# Patient Record
Sex: Female | Born: 1965 | Race: Black or African American | Hispanic: No | Marital: Married | Smoking: Never smoker
Health system: Southern US, Community
[De-identification: ages and names within clinical notes are randomized; demographics above are authoritative.]

## PROBLEM LIST (undated history)

## (undated) DIAGNOSIS — F419 Anxiety disorder, unspecified: Secondary | ICD-10-CM

## (undated) DIAGNOSIS — M792 Neuralgia and neuritis, unspecified: Secondary | ICD-10-CM

## (undated) DIAGNOSIS — I10 Essential (primary) hypertension: Secondary | ICD-10-CM

## (undated) HISTORY — PX: ABDOMINAL HYSTERECTOMY: SHX81

## (undated) HISTORY — DX: Anxiety disorder, unspecified: F41.9

## (undated) HISTORY — PX: WISDOM TOOTH EXTRACTION: SHX21

---

## 2005-01-03 ENCOUNTER — Ambulatory Visit: Payer: Self-pay | Admitting: General Practice

## 2005-02-17 ENCOUNTER — Ambulatory Visit: Payer: Self-pay | Admitting: Oncology

## 2005-03-29 ENCOUNTER — Ambulatory Visit: Payer: Self-pay | Admitting: Gastroenterology

## 2005-04-15 ENCOUNTER — Ambulatory Visit: Payer: Self-pay | Admitting: Oncology

## 2006-07-11 ENCOUNTER — Emergency Department: Payer: Self-pay | Admitting: Emergency Medicine

## 2006-08-08 ENCOUNTER — Ambulatory Visit: Payer: Self-pay | Admitting: Family Medicine

## 2007-11-24 ENCOUNTER — Emergency Department: Payer: Self-pay | Admitting: Emergency Medicine

## 2008-08-02 ENCOUNTER — Emergency Department: Payer: Self-pay | Admitting: Emergency Medicine

## 2009-09-05 ENCOUNTER — Emergency Department: Payer: Self-pay | Admitting: Emergency Medicine

## 2009-12-21 ENCOUNTER — Ambulatory Visit: Payer: Self-pay | Admitting: Women's Health

## 2010-06-15 ENCOUNTER — Emergency Department: Payer: Self-pay | Admitting: Emergency Medicine

## 2011-02-21 ENCOUNTER — Inpatient Hospital Stay: Payer: Self-pay | Admitting: Student

## 2011-02-22 DIAGNOSIS — R079 Chest pain, unspecified: Secondary | ICD-10-CM

## 2011-03-01 ENCOUNTER — Encounter: Payer: Self-pay | Admitting: Cardiovascular Disease

## 2011-04-18 ENCOUNTER — Ambulatory Visit: Payer: Self-pay | Admitting: Family Medicine

## 2012-07-26 ENCOUNTER — Emergency Department: Payer: Self-pay | Admitting: Emergency Medicine

## 2012-07-26 LAB — CBC
HGB: 12.3 g/dL (ref 12.0–16.0)
MCHC: 32.7 g/dL (ref 32.0–36.0)
MCV: 85 fL (ref 80–100)
RDW: 14.7 % — ABNORMAL HIGH (ref 11.5–14.5)

## 2012-07-26 LAB — BASIC METABOLIC PANEL
Calcium, Total: 9.9 mg/dL (ref 8.5–10.1)
EGFR (Non-African Amer.): >60
Osmolality: 273 (ref 275–301)
Potassium: 3.9 mmol/L (ref 3.5–5.1)
Sodium: 137 mmol/L (ref 136–145)

## 2012-07-26 LAB — TROPONIN I: Troponin-I: <0.02 ng/mL

## 2012-11-26 DIAGNOSIS — N938 Other specified abnormal uterine and vaginal bleeding: Secondary | ICD-10-CM | POA: Insufficient documentation

## 2013-01-05 DIAGNOSIS — I10 Essential (primary) hypertension: Secondary | ICD-10-CM | POA: Insufficient documentation

## 2013-06-28 ENCOUNTER — Emergency Department: Payer: Self-pay | Admitting: Emergency Medicine

## 2013-07-01 DIAGNOSIS — J302 Other seasonal allergic rhinitis: Secondary | ICD-10-CM | POA: Insufficient documentation

## 2013-11-26 DIAGNOSIS — Z639 Problem related to primary support group, unspecified: Secondary | ICD-10-CM | POA: Insufficient documentation

## 2014-07-06 NOTE — Consult Note (Signed)
PATIENT NAME:  Debbie Hodge, Debbie Hodge DATE OF BIRTH:  1965-06-29  DATE OF CONSULTATION:  02/22/2011  REFERRING PHYSICIAN:   CONSULTING PHYSICIAN:  Hemang K. Sherryll BurgerShah, MD  PRIMARY CARE PHYSICIAN: Debbie MinaJames Hedrick, MD   PRIMARY NEUROLOGIST: [facility] Threasa [facility]    REASON FOR CONSULTATION: Right-sided weakness and numbness and chest pressure.   HISTORY OF PRESENT ILLNESS: Debbie Hodge is a 49 year old African American female who has been having right-sided numbness and weakness gradually getting worse since the last one week. Yesterday she started having some chest pain and heaviness in her chest which brought her to the ER. She has had negative Cardiology work-up so far including EKG and nuclear stress test.   The patient carries a diagnosis of multiple sclerosis since around November/December 2011. The patient felt like her symptoms started around August 2011 when she was having dizziness, blacking out spell with loss of consciousness preceded by presyncopal symptoms. If she goes into a hot shower, she feels worse and she has to "cool down". She occasionally has tunnel vision and she doesn't see what is on her side and bumps into things.   Occasionally she used to fall due to right leg buckling. [facility] with MRI of the brain and cervical spine spinal tap, lots of blood work, and sounded like visual evoked potential.   She had "spots" on her brain and spinal cord with diagnosis of possible multiple sclerosis but was not started on any disease modifying agents and was advised to have a repeat MRI of the brain in April but the patient didn't go as she was not having any symptoms.   The patient mentioned around in June of 2012 patient restarted having symptoms where she was stumbling and her vision was blurry and [facility].   The [facility] was [facility] but she will be in the  hospital [facility] so will not be able to make it to that appointment.    The patient also complained of migraine headaches for the last one week where she has bifrontal pounding headache associated with photophobia, phonophobia, nausea and vomiting lasting for 30 to 40 minutes which happens 2 to 3 times a week. This started in the early part of 2012 which has gotten better now.   PAST MEDICAL HISTORY:  1. Multiple sclerosis. 2. Hypertension.   PAST SURGICAL HISTORY: Positive for Cesarean section.  MEDICATIONS: I reviewed her current medication list.   ALLERGIES: Sulfa drugs.  SOCIAL HISTORY: She does not smoke, does not drink alcohol. She does not do any drugs. 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   FAMILY HISTORY: Father had lupus. No history of any autoimmune disease or coronary artery disease, etc.   REVIEW OF SYSTEMS: 10 system review of systems was asked and was negative except for that mentioned in history of present illness.   PHYSICAL EXAMINATION:   VITAL SIGNS: Temperature 98, pulse 90, respiratory rate 18, blood pressure 123/73, pulse oximetry 100%.   LUNGS: Clear to auscultation.  HEART: S1, S2 heart sounds. Carotid exam did not reveal any bruit.   NEUROLOGIC:  MENTAL STATUS: She was alert, oriented, followed two-step inverted commands, was able to provide the history by herself. Attention, concentration, and memory seemed to be intact.   On her cranial nerve exam, her pupils are equal, round, and reactive. Extraocular movements are intact. I did not  see any afferent pupillary defect. Her left face seemed to be a little bit asymmetric compared to the right side evidenced by not so prominent nasolabial fold but the husband felt like that was her baseline.   Her tongue was midline. Her facial sensations were intact. Her hearing was intact.   It was difficult to check her visual fields. Her funduscopic exam did not reveal any optic discoloration, etc.   On her motor  exam she had normal tone and strength of 5 out of 5.   She had also normal tone and strength in her bilateral lower extremities but the patient mentioned that before she came to the hospital she had a hard time opening up her medication bottles with the right hand and it was difficult to hyperextend her right knee but she was able to do now during my exam.   Her reflexes were symmetric. She does not have Hoffmann sign. Her toes were mute.  LABORATORY, DIAGNOSTIC, AND RADIOLOGICAL DATA: On her MRI of the brain with and without contrast she does have some bilateral subcortical, occipital, and parietal FLAIR hyperintensities which do not enhance with gadolinium.   They are not characteristic of demyelinating disease. The are not characteristically periventricular or perpendicular to corpus callosum, etc.   ASSESSMENT AND PLAN: Right-sided weakness and numbness, transient, which has resolved in a patient who carries a diagnosis of MS. I would like to confirm the diagnosis by obtaining MRI of the cervical spine with and without contrast. I would like to look at her lumbar puncture and other visual evoked potential report but based on her MRI I cannot give her diagnosis of relapsing-remitting multiple sclerosis.   I mentioned this to the patient and it seems like her diagnosis was questionable from the beginning as they have not started her on any disease modifying agent.   The patient had plans to go to [facility] for second opinion so I advised the patient to make sure to get her MRI of the brain and cervical spine with and without contrast on a CD from [facility] as well as from [facility] during her visit to [facility].   I agree with giving Solu-Medrol for now. Once her three days are over, she can get oral prednisone taper 60, 50, 40, 20, 10 per day.   Chest pain is significantly improved. Work-up per hospitalist physician.   It was my pleasure to see Debbie Hodge here in the hospital.  Feel free to contact me with any further questions. I will see her on a p.r.n. basis.   ____________________________ Durene Cal. Sherryll Burger, MD hks:drc D: 02/22/2011 21:06:00 ET T: 02/23/2011 08:41:41 ET JOB#: [id number]  cc: Hemang K. Sherryll Burger, MD, <Dictator> Durene Cal [facility] MD ELECTRONICALLY SIGNED 03/18/2011 9:08

## 2014-08-06 DIAGNOSIS — R21 Rash and other nonspecific skin eruption: Secondary | ICD-10-CM | POA: Insufficient documentation

## 2014-08-06 DIAGNOSIS — M545 Low back pain, unspecified: Secondary | ICD-10-CM | POA: Insufficient documentation

## 2014-09-03 DIAGNOSIS — R7301 Impaired fasting glucose: Secondary | ICD-10-CM | POA: Insufficient documentation

## 2014-09-03 DIAGNOSIS — IMO0002 Reserved for concepts with insufficient information to code with codable children: Secondary | ICD-10-CM | POA: Insufficient documentation

## 2014-09-03 DIAGNOSIS — R899 Unspecified abnormal finding in specimens from other organs, systems and tissues: Secondary | ICD-10-CM | POA: Insufficient documentation

## 2014-10-30 ENCOUNTER — Emergency Department: Payer: Managed Care, Other (non HMO)

## 2014-10-30 ENCOUNTER — Encounter: Payer: Self-pay | Admitting: Emergency Medicine

## 2014-10-30 ENCOUNTER — Emergency Department
Admission: EM | Admit: 2014-10-30 | Discharge: 2014-10-30 | Disposition: A | Discharge status: Home / Self Care | Payer: Managed Care, Other (non HMO) | Attending: Emergency Medicine | Admitting: Emergency Medicine

## 2014-10-30 ENCOUNTER — Other Ambulatory Visit: Payer: Self-pay

## 2014-10-30 DIAGNOSIS — R0602 Shortness of breath: Secondary | ICD-10-CM | POA: Diagnosis not present

## 2014-10-30 DIAGNOSIS — I10 Essential (primary) hypertension: Secondary | ICD-10-CM | POA: Insufficient documentation

## 2014-10-30 DIAGNOSIS — M546 Pain in thoracic spine: Secondary | ICD-10-CM | POA: Diagnosis not present

## 2014-10-30 DIAGNOSIS — M542 Cervicalgia: Secondary | ICD-10-CM | POA: Insufficient documentation

## 2014-10-30 HISTORY — DX: Essential (primary) hypertension: I10

## 2014-10-30 MED ORDER — HYDROCODONE-ACETAMINOPHEN 5-325 MG PO TABS: 1.0000 | ORAL_TABLET | ORAL | Status: DC | PRN

## 2014-10-30 MED ORDER — KETOROLAC TROMETHAMINE 60 MG/2ML IM SOLN
60.0000 mg | Freq: Once | INTRAMUSCULAR | Status: AC
  Administered 2014-10-30: 60 mg via INTRAMUSCULAR
  Filled 2014-10-30: qty 2

## 2014-10-30 MED ORDER — CYCLOBENZAPRINE HCL 10 MG PO TABS: 10.0000 mg | ORAL_TABLET | Freq: Three times a day (TID) | ORAL | Status: DC | PRN

## 2014-10-30 NOTE — ED Provider Notes (Signed)
[facility] Emergency Department Provider Note  ____________________________________________  Time seen: Approximately 4:18 PM  I have reviewed the triage vital signs and the nursing notes.   HISTORY  Chief Complaint Back Pain    HPI Debbie Hodge is a 49 y.o. female since with complaint of mid back and neck pain. Onset today. Denies any trauma or known history of pain.Patient states that she was having some sharp episodes of difficulty breathing associated with that. Feels fine as long as the rest and not moving.  Past Medical History  Diagnosis Date  . Hypertension     There are no active problems to display for this patient.   Past Surgical History  Procedure Laterality Date  . Abdominal hysterectomy      Current Outpatient Rx  Name  Route  Sig  Dispense  Refill  . cyclobenzaprine (FLEXERIL) 10 MG tablet   Oral   Take 1 tablet (10 mg total) by mouth every 8 (eight) hours as needed for muscle spasms.   30 tablet   1   . HYDROcodone-acetaminophen (NORCO) 5-325 MG per tablet   Oral   Take 1-2 tablets by mouth every 4 (four) hours as needed for moderate pain.   8 tablet   0     Allergies Sulfa antibiotics and Tramadol  No family history on file.  Social History Social History  Substance Use Topics  . Smoking status: Never Smoker   . Smokeless tobacco: None  . Alcohol Use: No    Review of Systems Constitutional: No fever/chills Eyes: No visual changes. ENT: No sore throat. Cardiovascular: Denies chest pain. Respiratory: Positive for shortness of breath. Gastrointestinal: No abdominal pain.  No nausea, no vomiting.  No diarrhea.  No constipation. Genitourinary: Negative for dysuria. Musculoskeletal: Positive for thoracic back pain. Skin: Negative for rash. Neurological: Negative for headaches, focal weakness or numbness.  10-point ROS otherwise negative.  ____________________________________________   PHYSICAL  EXAM:  VITAL SIGNS: ED Triage Vitals  Enc Vitals Group     BP 10/30/14 1530 145/90 mmHg     Pulse Rate 10/30/14 1530 83     Resp 10/30/14 1530 22     Temp 10/30/14 1530 98 F (36.7 C)     Temp Source 10/30/14 1530 Oral     SpO2 10/30/14 1530 100 %     Weight 10/30/14 1530 177 lb (80.287 kg)     Height 10/30/14 1530  (1.549 m)     Head Cir --      Peak Flow --      Pain Score 10/30/14 1535 8     Pain Loc --      Pain Edu? --      Excl. in GC? --     Constitutional: Alert and oriented. Well appearing and in no acute distress. Eyes: Conjunctivae are normal. PERRL. EOMI. Head: Atraumatic. Nose: No congestion/rhinnorhea. Mouth/Throat: Mucous membranes are moist.  Oropharynx non-erythematous. Neck: No stridor.   Cardiovascular: Normal rate, regular rhythm. Grossly normal heart sounds.  Good peripheral circulation. Respiratory: Normal respiratory effort.  No retractions. Lungs CTAB. Gastrointestinal: Soft and nontender. No distention. No abdominal bruits. No CVA tenderness. Musculoskeletal: Positive cervical thoracic or muscle spinal tenderness. Neurologic:  Normal speech and language. No gross focal neurologic deficits are appreciated. No gait instability. Skin:  Skin is warm, dry and intact. No rash noted. Psychiatric: Mood and affect are normal. Speech and behavior are normal.  ____________________________________________   LABS (all labs ordered are listed, but  only abnormal results are displayed)  Labs Reviewed - No data to display ____________________________________________   RADIOLOGY  X-ray negative. ____________________________________________   PROCEDURES  Procedure(s) performed: None  Critical Care performed: No  ____________________________________________   INITIAL IMPRESSION / ASSESSMENT AND PLAN / ED COURSE  Pertinent labs & imaging results that were available during my care of the patient were reviewed by me and considered in my medical  decision making (see chart for details).  Myofascial strain. Patient being currently worked up for lupus and MS. Rx given for Flexeril 10 mg 3 times a day to take along with her ibuprofen 800 that she has at home. Patient voices no other emergency medical complaints at this time. ____________________________________________   FINAL CLINICAL IMPRESSION(S) / ED DIAGNOSES  Final diagnoses:  Bilateral thoracic back pain      Evangeline Dakin, PA-C 10/30/14 1703  Phineas Semen, MD 10/30/14 [phone]

## 2014-10-30 NOTE — ED Notes (Signed)
Pt presents with mid back pain and neck pain started today.

## 2014-10-30 NOTE — Discharge Instructions (Signed)
Back Pain, Adult Low back pain is very common. About 1 in 5 people have back pain.The cause of low back pain is rarely dangerous. The pain often gets better over time.About half of people with a sudden onset of back pain feel better in just 2 weeks. About 8 in 10 people feel better by 6 weeks.  CAUSES Some common causes of back pain include:  Strain of the muscles or ligaments supporting the spine.  Wear and tear (degeneration) of the spinal discs.  Arthritis.  Direct injury to the back. DIAGNOSIS Most of the time, the direct cause of low back pain is not known.However, back pain can be treated effectively even when the exact cause of the pain is unknown.Answering your caregiver's questions about your overall health and symptoms is one of the most accurate ways to make sure the cause of your pain is not dangerous. If your caregiver needs more information, he or she may order lab work or imaging tests (X-rays or MRIs).However, even if imaging tests show changes in your back, this usually does not require surgery. HOME CARE INSTRUCTIONS For many people, back pain returns.Since low back pain is rarely dangerous, it is often a condition that people can learn to manageon their own.   Remain active. It is stressful on the back to sit or stand in one place. Do not sit, drive, or stand in one place for more than 30 minutes at a time. Take short walks on level surfaces as soon as pain allows.Try to increase the length of time you walk each day.  Do not stay in bed.Resting more than 1 or 2 days can delay your recovery.  Do not avoid exercise or work.Your body is made to move.It is not dangerous to be active, even though your back may hurt.Your back will likely heal faster if you return to being active before your pain is gone.  Pay attention to your body when you bend and lift. Many people have less discomfortwhen lifting if they bend their knees, keep the load close to their bodies,and  avoid twisting. Often, the most comfortable positions are those that put less stress on your recovering back.  Find a comfortable position to sleep. Use a firm mattress and lie on your side with your knees slightly bent. If you lie on your back, put a pillow under your knees.  Only take over-the-counter or prescription medicines as directed by your caregiver. Over-the-counter medicines to reduce pain and inflammation are often the most helpful.Your caregiver may prescribe muscle relaxant drugs.These medicines help dull your pain so you can more quickly return to your normal activities and healthy exercise.  Put ice on the injured area.  Put ice in a plastic bag.  Place a towel between your skin and the bag.  Leave the ice on for 15-20 minutes, 03-04 times a day for the first 2 to 3 days. After that, ice and heat may be alternated to reduce pain and spasms.  Ask your caregiver about trying back exercises and gentle massage. This may be of some benefit.  Avoid feeling anxious or stressed.Stress increases muscle tension and can worsen back pain.It is important to recognize when you are anxious or stressed and learn ways to manage it.Exercise is a great option. SEEK MEDICAL CARE IF:  You have pain that is not relieved with rest or medicine.  You have pain that does not improve in 1 week.  You have new symptoms.  You are generally not feeling well. SEEK   IMMEDIATE MEDICAL CARE IF:   You have pain that radiates from your back into your legs.  You develop new bowel or bladder control problems.  You have unusual weakness or numbness in your arms or legs.  You develop nausea or vomiting.  You develop abdominal pain.  You feel faint. Document Released: 02/28/2005 Document Revised: 08/30/2011 Document Reviewed: 07/02/2013 ExitCare Patient Information 2015 [employer]. This information is not intended to replace advice given to you by your health care provider. Make sure you  discuss any questions you have with your health care provider.  

## 2014-10-30 NOTE — ED Notes (Signed)
EDP at bedside for eval 

## 2014-12-02 DIAGNOSIS — G894 Chronic pain syndrome: Secondary | ICD-10-CM | POA: Insufficient documentation

## 2015-02-24 ENCOUNTER — Encounter: Payer: Self-pay | Admitting: Medical Oncology

## 2015-02-24 ENCOUNTER — Emergency Department
Admission: EM | Admit: 2015-02-24 | Discharge: 2015-02-24 | Disposition: A | Discharge status: Home / Self Care | Payer: Managed Care, Other (non HMO) | Attending: Emergency Medicine | Admitting: Emergency Medicine

## 2015-02-24 DIAGNOSIS — M545 Low back pain: Secondary | ICD-10-CM | POA: Diagnosis not present

## 2015-02-24 DIAGNOSIS — I10 Essential (primary) hypertension: Secondary | ICD-10-CM | POA: Insufficient documentation

## 2015-02-24 DIAGNOSIS — M62830 Muscle spasm of back: Secondary | ICD-10-CM | POA: Insufficient documentation

## 2015-02-24 DIAGNOSIS — M7918 Myalgia, other site: Secondary | ICD-10-CM

## 2015-02-24 HISTORY — DX: Neuralgia and neuritis, unspecified: M79.2

## 2015-02-24 MED ORDER — OXYCODONE-ACETAMINOPHEN 5-325 MG PO TABS: 1.0000 | ORAL_TABLET | Freq: Four times a day (QID) | ORAL | Status: DC | PRN

## 2015-02-24 MED ORDER — METHOCARBAMOL 500 MG PO TABS
1000.0000 mg | ORAL_TABLET | Freq: Once | ORAL | Status: AC
  Administered 2015-02-24: 1000 mg via ORAL

## 2015-02-24 MED ORDER — ORPHENADRINE CITRATE 30 MG/ML IJ SOLN
60.0000 mg | Freq: Two times a day (BID) | INTRAMUSCULAR | Status: DC
  Filled 2015-02-24: qty 2

## 2015-02-24 MED ORDER — KETOROLAC TROMETHAMINE 60 MG/2ML IM SOLN
60.0000 mg | Freq: Once | INTRAMUSCULAR | Status: AC
  Administered 2015-02-24: 60 mg via INTRAMUSCULAR

## 2015-02-24 MED ORDER — HYDROMORPHONE HCL 1 MG/ML IJ SOLN
1.0000 mg | Freq: Once | INTRAMUSCULAR | Status: AC
  Administered 2015-02-24: 1 mg via INTRAMUSCULAR

## 2015-02-24 MED ORDER — HYDROMORPHONE HCL 1 MG/ML IJ SOLN
INTRAMUSCULAR | Status: AC
  Filled 2015-02-24: qty 1

## 2015-02-24 MED ORDER — KETOROLAC TROMETHAMINE 60 MG/2ML IM SOLN
INTRAMUSCULAR | Status: AC
  Filled 2015-02-24: qty 2

## 2015-02-24 MED ORDER — METHOCARBAMOL 500 MG PO TABS
ORAL_TABLET | ORAL | Status: AC
  Filled 2015-02-24: qty 2

## 2015-02-24 NOTE — ED Provider Notes (Signed)
[facility] Emergency Department Provider Note  ____________________________________________  Time seen: Approximately 10:35 AM  I have reviewed the triage vital signs and the nursing notes.   HISTORY  Chief Complaint Back Pain    HPI Debbie Hodge is a 49 y.o. female patient complaining of low back pain with intense spasms. He states she has a history of back pain with muscle spasms. Patient states she normally takes cyclobenzaprine and Norco as she waited too late and did not take the medicine this morning. Patient is rating her pain as a 10 over 10. Patient described the pain as spasmodic and sharp. No positives measures taken today for this complaint. He denies any radicular component to this pain. Patient denies any bladder or bowel dysfunction.   Past Medical History  Diagnosis Date  . Hypertension   . Neuropathic pain     There are no active problems to display for this patient.   Past Surgical History  Procedure Laterality Date  . Abdominal hysterectomy      Current Outpatient Rx  Name  Route  Sig  Dispense  Refill  . cyclobenzaprine (FLEXERIL) 10 MG tablet   Oral   Take 1 tablet (10 mg total) by mouth every 8 (eight) hours as needed for muscle spasms.   30 tablet   1   . HYDROcodone-acetaminophen (NORCO) 5-325 MG per tablet   Oral   Take 1-2 tablets by mouth every 4 (four) hours as needed for moderate pain.   8 tablet   0   . oxyCODONE-acetaminophen (ROXICET) 5-325 MG tablet   Oral   Take 1 tablet by mouth every 6 (six) hours as needed for moderate pain.   12 tablet   0     Allergies Sulfa antibiotics and Tramadol  No family history on file.  Social History Social History  Substance Use Topics  . Smoking status: Never Smoker   . Smokeless tobacco: None  . Alcohol Use: No    Review of Systems Constitutional: No fever/chills Eyes: No visual changes. ENT: No sore throat. Cardiovascular: Denies chest  pain. Respiratory: Denies shortness of breath. Gastrointestinal: No abdominal pain.  No nausea, no vomiting.  No diarrhea.  No constipation. Genitourinary: Negative for dysuria. Musculoskeletal: Positive for back pain. Skin: Negative for rash. Neurological: Negative for headaches, focal weakness or numbness. Endocrine:Hypertension Hematological/Lymphatic: Allergic/Immunilogical: Soft and tramadol. 10-point ROS otherwise negative.  ____________________________________________   PHYSICAL EXAM:  VITAL SIGNS: ED Triage Vitals  Enc Vitals Group     BP 02/24/15 0953 139/73 mmHg     Pulse Rate 02/24/15 0953 96     Resp 02/24/15 0953 20     Temp --      Temp src --      SpO2 02/24/15 0953 100 %     Weight 02/24/15 0953 180 lb (81.647 kg)     Height 02/24/15 0953 5\' 1"  (1.549 m)     Head Cir --      Peak Flow --      Pain Score 02/24/15 0953 10     Pain Loc --      Pain Edu? --      Excl. in GC? --     Constitutional: Alert and oriented. Moderate distress Eyes: Conjunctivae are normal. PERRL. EOMI. Head: Atraumatic. Nose: No congestion/rhinnorhea. Mouth/Throat: Mucous membranes are moist.  Oropharynx non-erythematous. Neck: No stridor. No cervical spine tenderness to palpation. Hematological/Lymphatic/Immunilogical: No cervical lymphadenopathy. Cardiovascular: Normal rate, regular rhythm. Grossly normal heart sounds.  Good peripheral circulation. Respiratory: Normal respiratory effort.  No retractions. Lungs CTAB. Gastrointestinal: Soft and nontender. No distention. No abdominal bruits. No CVA tenderness. Musculoskeletal: Patient is standing and flexed position almost at a 90. Patient decreased range of motion with extension. Bilateral paraspinal spinal muscle spasms palpated.  Neurologic:  Normal speech and language. No gross focal neurologic deficits are appreciated. No gait instability. Skin:  Skin is warm, dry and intact. No rash noted. Psychiatric: Mood and affect are  normal. Speech and behavior are normal.  ____________________________________________   LABS (all labs ordered are listed, but only abnormal results are displayed)  Labs Reviewed - No data to display ____________________________________________  EKG   ____________________________________________  RADIOLOGY   ____________________________________________   PROCEDURES  Procedure(s) performed: None  Critical Care performed: No  ____________________________________________   INITIAL IMPRESSION / ASSESSMENT AND PLAN / ED COURSE  Pertinent labs & imaging results that were available during my care of the patient were reviewed by me and considered in my medical decision making (see chart for details).  Acute myofascial pain. Patient states decreased pain status post Dilaudid, Robaxin, and Toradol. Patient will follow up with treatment rheumatologist at [facility]. ____________________________________________   FINAL CLINICAL IMPRESSION(S) / ED DIAGNOSES  Final diagnoses:  Acute myofascial pain       Joni Reining, PA-C 02/24/15 1115  Minna Antis, MD 02/24/15 [phone]

## 2015-02-24 NOTE — ED Notes (Signed)
Pt to triage with reports of back pain that began last night, reports spasms worsened this am.

## 2015-02-24 NOTE — ED Notes (Signed)
Developed lower back spasms today   States hx of same  Pain is moving into legs.ambulates slowly d/t pain states she has meds to take for same but thinks she waited too long to take

## 2015-03-03 DIAGNOSIS — M797 Fibromyalgia: Secondary | ICD-10-CM | POA: Insufficient documentation

## 2015-08-12 ENCOUNTER — Ambulatory Visit (INDEPENDENT_AMBULATORY_CARE_PROVIDER_SITE_OTHER): Payer: 59 | Admitting: Psychiatry

## 2015-08-12 ENCOUNTER — Encounter: Payer: Self-pay | Admitting: Psychiatry

## 2015-08-12 VITALS — BP 142/98 | HR 90 | Temp 97.7°F | Ht 61.0 in | Wt 179.4 lb

## 2015-08-12 DIAGNOSIS — F411 Generalized anxiety disorder: Secondary | ICD-10-CM | POA: Diagnosis not present

## 2015-08-12 DIAGNOSIS — E559 Vitamin D deficiency, unspecified: Secondary | ICD-10-CM | POA: Insufficient documentation

## 2015-08-12 DIAGNOSIS — G629 Polyneuropathy, unspecified: Secondary | ICD-10-CM | POA: Insufficient documentation

## 2015-08-12 MED ORDER — SERTRALINE HCL 25 MG PO TABS: 25.0000 mg | ORAL_TABLET | Freq: Every day | ORAL | Status: AC

## 2015-08-12 MED ORDER — TRAZODONE HCL 50 MG PO TABS: 50.0000 mg | ORAL_TABLET | Freq: Every day | ORAL | Status: AC
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
## 2015-08-27 ENCOUNTER — Ambulatory Visit: Payer: 59 | Admitting: Psychiatry

## 2015-08-31 ENCOUNTER — Ambulatory Visit: Payer: 59 | Admitting: Licensed Clinical Social Worker

## 2015-09-21 ENCOUNTER — Ambulatory Visit: Payer: 59 | Admitting: Psychiatry

## 2016-02-15 ENCOUNTER — Encounter: Payer: Self-pay | Admitting: Medical Oncology

## 2016-02-15 ENCOUNTER — Emergency Department
Admission: EM | Admit: 2016-02-15 | Discharge: 2016-02-15 | Disposition: A | Discharge status: Home / Self Care | Payer: Managed Care, Other (non HMO) | Attending: Emergency Medicine | Admitting: Emergency Medicine

## 2016-02-15 DIAGNOSIS — Z79899 Other long term (current) drug therapy: Secondary | ICD-10-CM | POA: Diagnosis not present

## 2016-02-15 DIAGNOSIS — Z5321 Procedure and treatment not carried out due to patient leaving prior to being seen by health care provider: Secondary | ICD-10-CM | POA: Diagnosis not present

## 2016-02-15 DIAGNOSIS — I10 Essential (primary) hypertension: Secondary | ICD-10-CM | POA: Insufficient documentation

## 2016-02-15 DIAGNOSIS — R52 Pain, unspecified: Secondary | ICD-10-CM | POA: Diagnosis not present

## 2016-02-15 DIAGNOSIS — Z791 Long term (current) use of non-steroidal anti-inflammatories (NSAID): Secondary | ICD-10-CM | POA: Diagnosis not present

## 2016-02-15 NOTE — ED Triage Notes (Signed)
Pt reports hx of fibromyalgia and for the past week she has been having body aches especially lower back into buttocks.

## 2016-02-15 NOTE — ED Notes (Signed)
See triage note   States she is having spasm to lower back which radiates into both legs  And feels like her head is pushing into shoulders    Was placed on prednisone taper last week    Which eased the pain slightly

## 2016-02-15 NOTE — ED Notes (Signed)
Unable to get bp due to pain in arms

## 2016-02-15 NOTE — ED Notes (Signed)
Not in room for PA eval

## 2016-02-15 NOTE — ED Notes (Signed)
Explained to pt of delay  ..Marland Kitchen

## 2018-10-12 ENCOUNTER — Other Ambulatory Visit: Payer: Self-pay

## 2018-10-12 ENCOUNTER — Ambulatory Visit (LOCAL_COMMUNITY_HEALTH_CENTER): Payer: BLUE CROSS/BLUE SHIELD

## 2018-10-12 DIAGNOSIS — Z23 Encounter for immunization: Secondary | ICD-10-CM

## 2018-10-12 NOTE — Progress Notes (Signed)
At [facility] for Hepatitis B vaccine only. Pt states she can not find any immunization records that indicate she has had any Hep B vaccines.

## 2019-05-11 ENCOUNTER — Ambulatory Visit: Payer: Managed Care, Other (non HMO) | Attending: Internal Medicine

## 2019-05-11 DIAGNOSIS — Z23 Encounter for immunization: Secondary | ICD-10-CM | POA: Insufficient documentation

## 2019-05-11 NOTE — Progress Notes (Signed)
   Covid-19 Vaccination Clinic  Name:  Debbie Hodge    MRN: [id number] DOB: [birth year]  05/11/2019  [name] was observed post Covid-19 immunization for 15 minutes without incidence. She was provided with Vaccine Information Sheet and instruction to access the V-Safe system.   [name] was instructed to call 911 with any severe reactions post vaccine: Marland Kitchen Difficulty breathing  . Swelling of your face and throat  . A fast heartbeat  . A bad rash all over your body  . Dizziness and weakness    Immunizations Administered    Name Date Dose VIS Date Route   Moderna COVID-19 Vaccine 05/11/2019  1:30 AM 0.5 mL 02/12/2019 Intramuscular   Manufacturer: Moderna   Lot: [id number]   NDC: 25910-289-02

## 2019-06-08 ENCOUNTER — Ambulatory Visit: Payer: Managed Care, Other (non HMO) | Attending: Internal Medicine

## 2019-06-08 DIAGNOSIS — Z23 Encounter for immunization: Secondary | ICD-10-CM

## 2019-06-08 NOTE — Progress Notes (Signed)
   Covid-19 Vaccination Clinic  Name:  EMONIE ESPERICUETA    MRN: [id number] DOB: Sep 23, 1965  06/08/2019  Ms. Lotter was observed post Covid-19 immunization for 15 minutes without incident. She was provided with Vaccine Information Sheet and instruction to access the V-Safe system.   Ms. Muchmore was instructed to call 911 with any severe reactions post vaccine: Marland Kitchen Difficulty breathing  . Swelling of face and throat  . A fast heartbeat  . A bad rash all over body  . Dizziness and weakness   Immunizations Administered    Name Date Dose VIS Date Route   Moderna COVID-19 Vaccine 06/08/2019  9:31 AM 0.5 mL 02/12/2019 Intramuscular   Manufacturer: Moderna   Lot: [id number]   NDC: 31517-616-07

## 2019-08-20 ENCOUNTER — Other Ambulatory Visit: Payer: Self-pay | Admitting: Internal Medicine

## 2019-08-20 DIAGNOSIS — Z1231 Encounter for screening mammogram for malignant neoplasm of breast: Secondary | ICD-10-CM

## 2019-12-12 ENCOUNTER — Other Ambulatory Visit: Payer: Self-pay | Admitting: Internal Medicine

## 2019-12-12 DIAGNOSIS — R7989 Other specified abnormal findings of blood chemistry: Secondary | ICD-10-CM

## 2019-12-23 ENCOUNTER — Ambulatory Visit: Admission: RE | Admit: 2019-12-23 | Payer: 59 | Source: Ambulatory Visit

## 2020-01-10 ENCOUNTER — Other Ambulatory Visit
Admission: RE | Admit: 2020-01-10 | Discharge: 2020-01-10 | Disposition: A | Discharge status: Home / Self Care | Payer: 59 | Source: Ambulatory Visit | Attending: Pulmonary Disease | Admitting: Pulmonary Disease

## 2020-01-10 DIAGNOSIS — R0602 Shortness of breath: Secondary | ICD-10-CM | POA: Insufficient documentation

## 2020-01-10 DIAGNOSIS — U099 Post covid-19 condition, unspecified: Secondary | ICD-10-CM | POA: Diagnosis not present

## 2020-01-10 LAB — BRAIN NATRIURETIC PEPTIDE: B Natriuretic Peptide: 57.5 pg/mL (ref 0.0–100.0)

## 2020-01-10 LAB — FIBRIN DERIVATIVES D-DIMER (ARMC ONLY): Fibrin derivatives D-dimer (ARMC): 139.16 ng/mL (FEU) (ref 0.00–499.00)

## 2020-01-22 ENCOUNTER — Other Ambulatory Visit: Payer: Self-pay

## 2020-01-22 ENCOUNTER — Encounter (INDEPENDENT_AMBULATORY_CARE_PROVIDER_SITE_OTHER): Payer: Self-pay

## 2020-01-22 ENCOUNTER — Ambulatory Visit
Admission: RE | Admit: 2020-01-22 | Discharge: 2020-01-22 | Disposition: A | Discharge status: Home / Self Care | Payer: 59 | Source: Ambulatory Visit | Attending: Internal Medicine | Admitting: Internal Medicine

## 2020-01-22 DIAGNOSIS — Z1231 Encounter for screening mammogram for malignant neoplasm of breast: Secondary | ICD-10-CM | POA: Insufficient documentation

## 2020-01-30 ENCOUNTER — Other Ambulatory Visit: Payer: Self-pay | Admitting: Internal Medicine

## 2020-01-30 DIAGNOSIS — R928 Other abnormal and inconclusive findings on diagnostic imaging of breast: Secondary | ICD-10-CM

## 2020-01-30 DIAGNOSIS — N6320 Unspecified lump in the left breast, unspecified quadrant: Secondary | ICD-10-CM

## 2020-02-10 ENCOUNTER — Other Ambulatory Visit: Payer: 59

## 2020-02-10 ENCOUNTER — Ambulatory Visit: Payer: 59 | Attending: Internal Medicine

## 2020-04-14 ENCOUNTER — Other Ambulatory Visit: Payer: Self-pay | Admitting: Internal Medicine

## 2020-05-07 ENCOUNTER — Other Ambulatory Visit: Payer: Self-pay | Admitting: Internal Medicine

## 2020-05-07 DIAGNOSIS — R928 Other abnormal and inconclusive findings on diagnostic imaging of breast: Secondary | ICD-10-CM

## 2020-07-17 ENCOUNTER — Other Ambulatory Visit: Admission: RE | Admit: 2020-07-17 | Payer: 59 | Source: Ambulatory Visit

## 2020-07-20 ENCOUNTER — Encounter: Payer: Self-pay | Admitting: *Deleted

## 2020-07-21 ENCOUNTER — Encounter: Payer: Self-pay | Admitting: Anesthesiology

## 2020-07-21 ENCOUNTER — Ambulatory Visit: Admission: RE | Admit: 2020-07-21 | Payer: 59 | Source: Home / Self Care

## 2020-07-21 ENCOUNTER — Encounter: Admission: RE | Payer: Self-pay | Source: Home / Self Care

## 2020-07-21 SURGERY — COLONOSCOPY WITH PROPOFOL
Anesthesia: General

## 2022-03-20 IMAGING — MG DIGITAL SCREENING BILAT W/ TOMO W/ CAD
8 series · 8 of 24 positions shown · non-contrast
Comparison: Previous exams.

CLINICAL DATA: Screening.

EXAM:
DIGITAL SCREENING BILATERAL MAMMOGRAM WITH TOMO AND CAD

[L CC synth-2D]
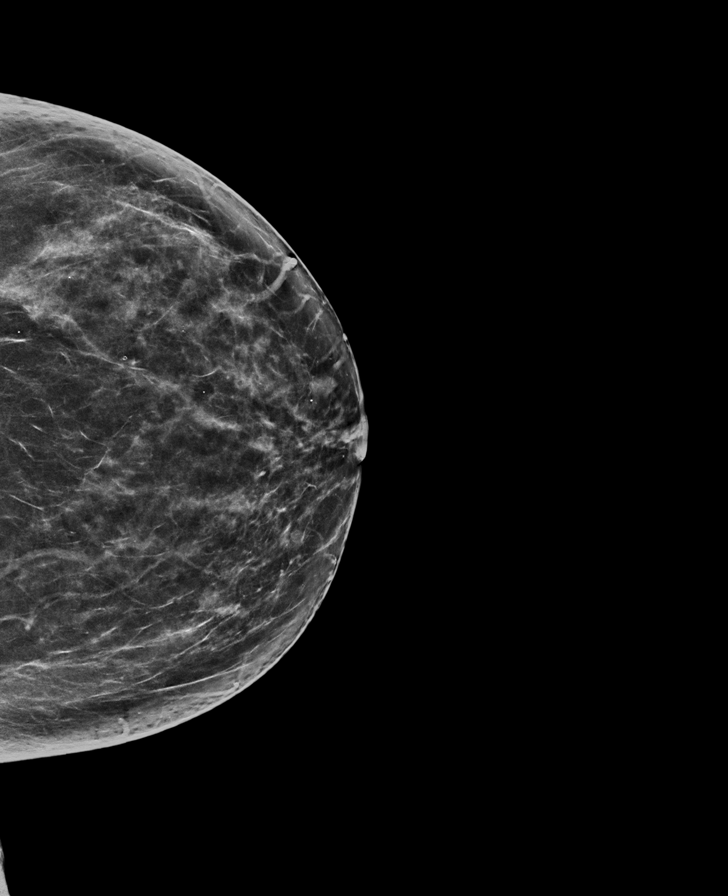

[R MLO synth-2D]
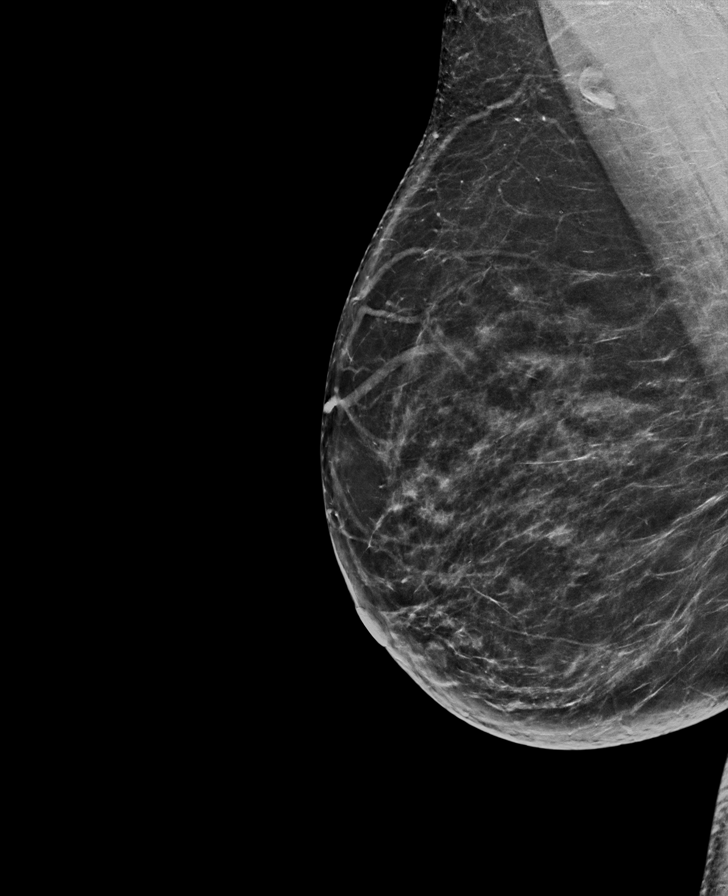

[R CC synth-2D]
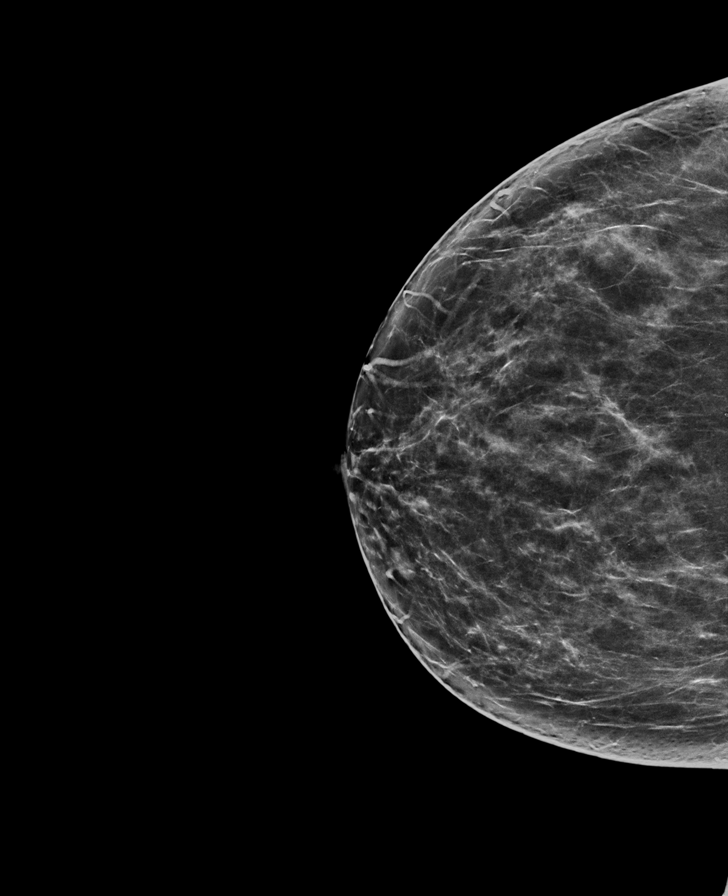

[L MLO synth-2D]
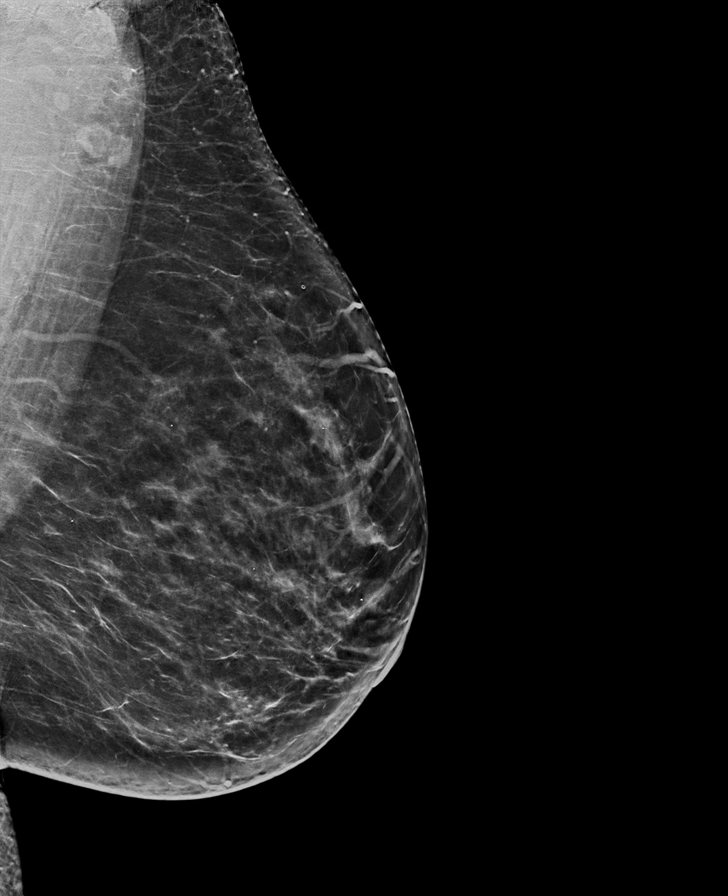

[R CC tomo · tomo slice 39/77.0]
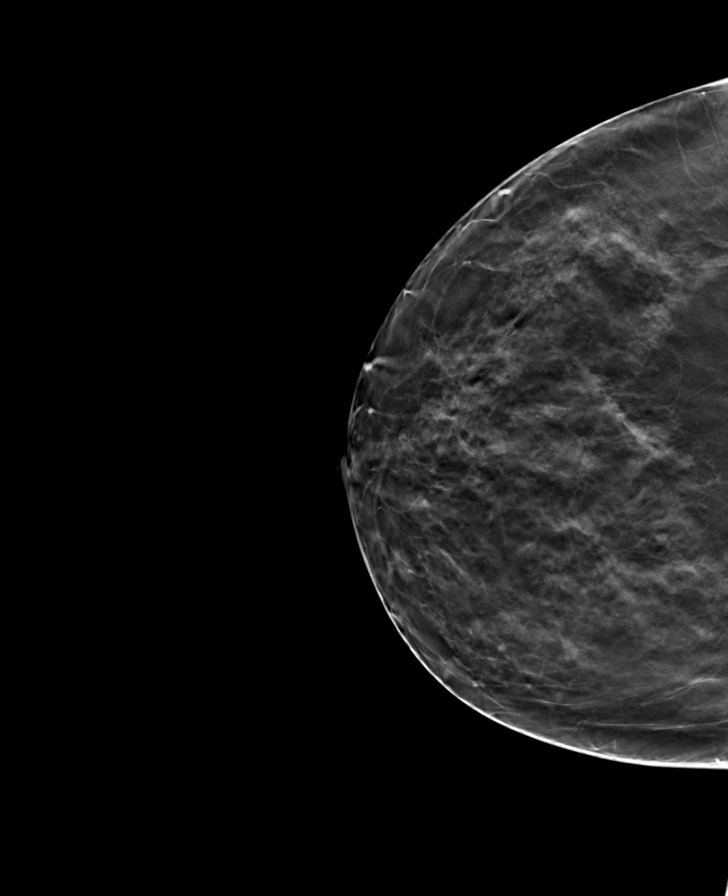

[L CC tomo · tomo slice 39/77.0]
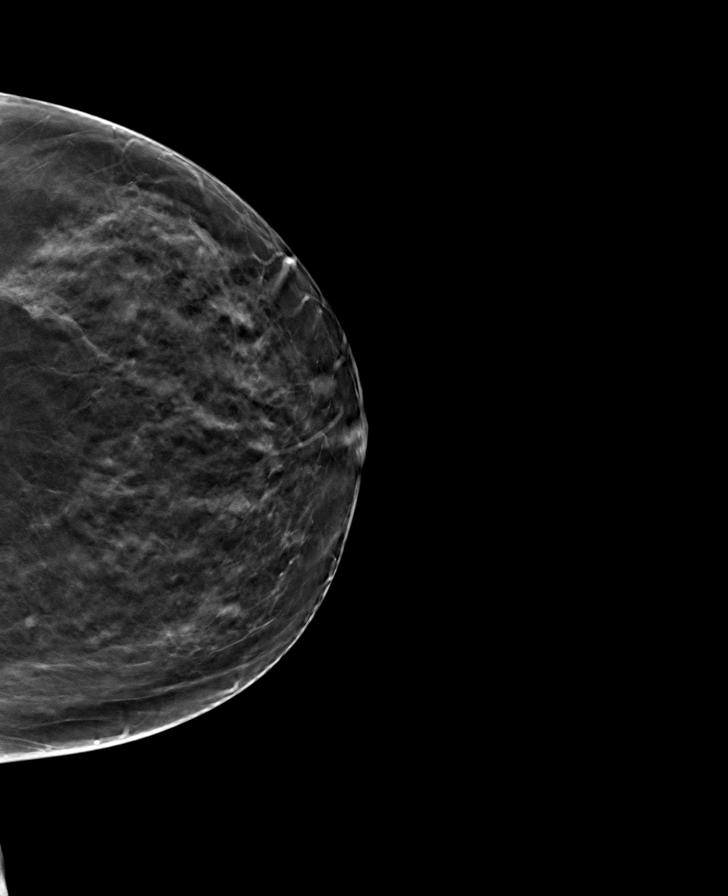

[L MLO tomo · tomo slice 43/84.0]
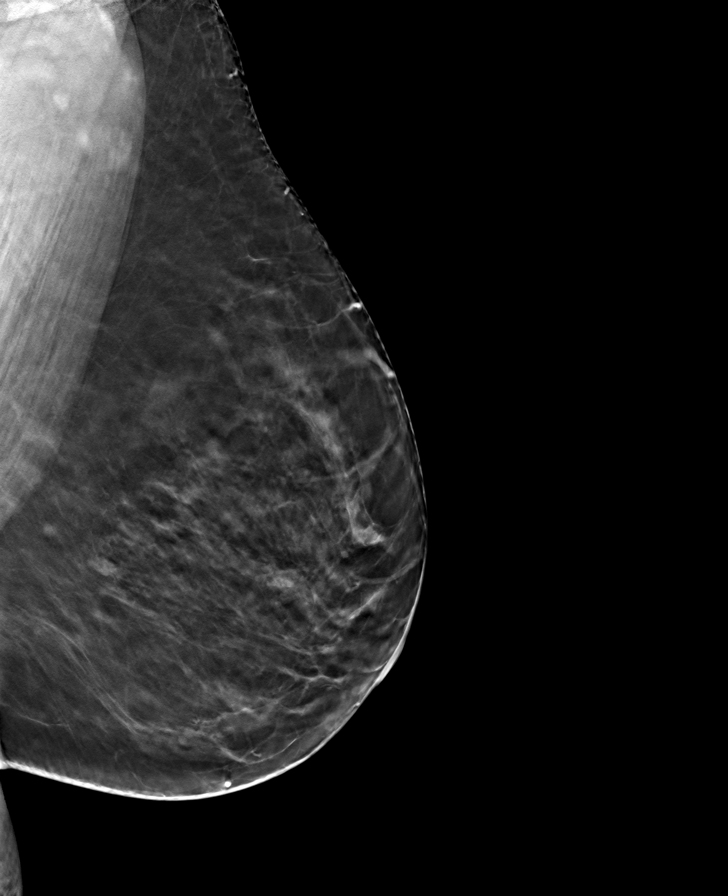

[R MLO tomo · tomo slice 41/82.0]
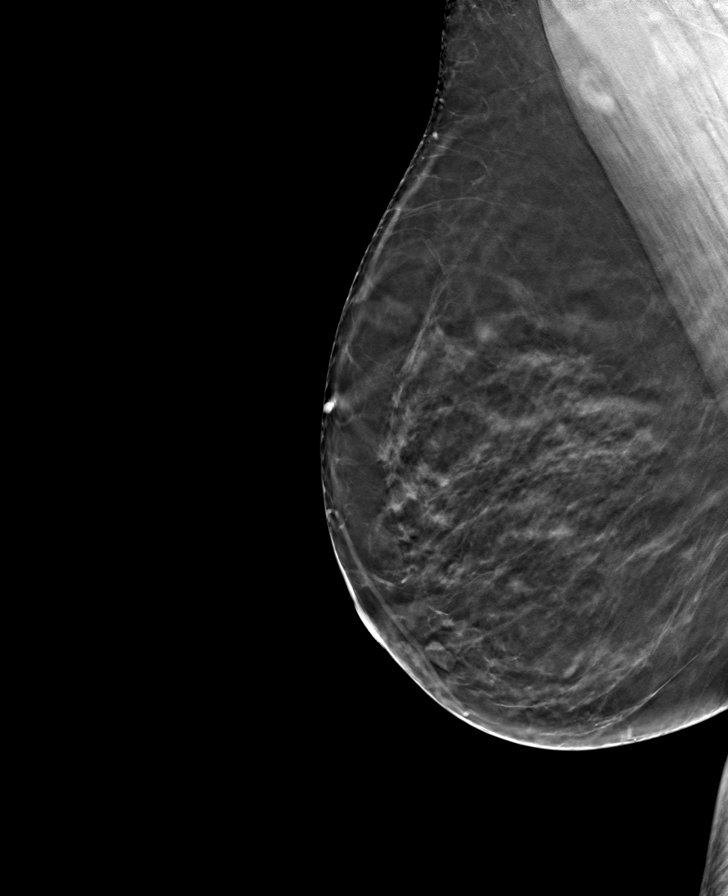

[8 of 24 positions shown; findings below may reference images not displayed]

ACR Breast Density Category c: The breast tissue is heterogeneously
dense, which may obscure small masses.
FINDINGS: In the left breast, a possible mass warrants further evaluation. In
the right breast, no findings suspicious for malignancy.

Images were processed with CAD.
IMPRESSION: Further evaluation is suggested for a possible mass in the left
breast.

RECOMMENDATION:
Diagnostic mammogram and possibly ultrasound of the left breast.
(Code:[id number])

The patient will be contacted regarding the findings, and additional
imaging will be scheduled.

BI-RADS CATEGORY  0: Incomplete. Need additional imaging evaluation
and/or prior mammograms for comparison.

## 2023-01-08 ENCOUNTER — Other Ambulatory Visit: Payer: Self-pay

## 2023-01-08 ENCOUNTER — Encounter: Payer: Self-pay | Admitting: Emergency Medicine

## 2023-01-08 ENCOUNTER — Emergency Department
Admission: EM | Admit: 2023-01-08 | Discharge: 2023-01-08 | Disposition: A | Discharge status: Home / Self Care | Payer: Self-pay | Attending: Emergency Medicine | Admitting: Emergency Medicine

## 2023-01-08 DIAGNOSIS — M5412 Radiculopathy, cervical region: Secondary | ICD-10-CM | POA: Insufficient documentation

## 2023-01-08 MED ORDER — DEXAMETHASONE SODIUM PHOSPHATE 10 MG/ML IJ SOLN
10.0000 mg | Freq: Once | INTRAMUSCULAR | Status: AC
  Administered 2023-01-08: 10 mg via INTRAMUSCULAR
  Filled 2023-01-08: qty 1

## 2023-01-08 MED ORDER — METHYLPREDNISOLONE 4 MG PO TBPK: ORAL_TABLET | ORAL | 0 refills | Status: AC

## 2023-01-08 MED ORDER — KETOROLAC TROMETHAMINE 15 MG/ML IJ SOLN
15.0000 mg | Freq: Once | INTRAMUSCULAR | Status: AC
  Administered 2023-01-08: 15 mg via INTRAMUSCULAR
  Filled 2023-01-08: qty 1

## 2023-01-08 MED ORDER — DIAZEPAM 2 MG PO TABS
2.0000 mg | ORAL_TABLET | Freq: Once | ORAL | Status: AC
  Administered 2023-01-08: 2 mg via ORAL
  Filled 2023-01-08: qty 1

## 2023-01-08 MED ORDER — METHOCARBAMOL 500 MG PO TABS: 500.0000 mg | ORAL_TABLET | Freq: Four times a day (QID) | ORAL | 0 refills | Status: AC

## 2023-01-08 MED ORDER — MELOXICAM 15 MG PO TABS: 15.0000 mg | ORAL_TABLET | Freq: Every day | ORAL | 0 refills | Status: AC

## 2023-01-08 NOTE — ED Triage Notes (Signed)
Pt states this past Wednesday she started to have left neck pain and left arm pain. Pt states no trauma, but stated it felt like "moving gas."  Pt states taking motrin and gas x, without relief. Pt states the pain has been getting worse.

## 2023-01-08 NOTE — Discharge Instructions (Addendum)
Take the meloxicam once a day for the next 2 weeks even if you start to feel better.  This is an NSAID so do not take any other NSAIDs like ibuprofen, naproxen, diclofenac, Advil or Aleve while taking this medication.  You can take the Robaxin 4 times a day for muscle spasms.  Keep in mind that this medication can be sedating so do not drive after taking it.  Take the Medrol Dosepak as prescribed.  You can take 650 mg of Tylenol every 6 hours as needed for pain.  You can continue to use topical pain relievers as well as ice and gentle massage.  I have attached some information about cervical radiculopathy for you to read.  Please follow-up with orthopedics whose information is attached if you do not have improvement in your symptoms.

## 2023-01-08 NOTE — ED Notes (Signed)
Patient declined discharge vital signs. 

## 2023-01-08 NOTE — ED Provider Notes (Signed)
[facility] Provider Note    Event Date/Time   First MD Initiated Contact with Patient 01/08/23 [phone]     (approximate)   History   Torticollis   HPI  Debbie Hodge is a 57 y.o. female presents for evaluation of left shoulder and neck pain.  Patient states this has been ongoing for about a week, she is tried managing the pain taking Tylenol, ibuprofen and using ice and topical pain relievers.  She is here today because the pain is unbearable.     Physical Exam   Triage Vital Signs: ED Triage Vitals  Encounter Vitals Group     BP 01/08/23 0906 (!) 147/102     Systolic BP Percentile --      Diastolic BP Percentile --      Pulse Rate 01/08/23 0906 90     Resp 01/08/23 0906 20     Temp 01/08/23 0906 97.7 F (36.5 C)     Temp Source 01/08/23 0906 Oral     SpO2 01/08/23 0906 100 %     Weight 01/08/23 0907 200 lb (90.7 kg)     Height 01/08/23 0907 5' 1.5" (1.562 m)     Head Circumference --      Peak Flow --      Pain Score 01/08/23 0906 10     Pain Loc --      Pain Education --      Exclude from Growth Chart --     Most recent vital signs: Vitals:   01/08/23 0906  BP: (!) 147/102  Pulse: 90  Resp: 20  Temp: 97.7 F (36.5 C)  SpO2: 100%   General: Awake, tearful, pacing the room in significant distress due to pain CV:  Good peripheral perfusion.  RRR. Resp:  Normal effort.  CTAB. Abd:  No distention.  Other:  Tender to palpation over the left trapezius.  Radial pulses 2+ and regular, 5/5 strength in bilateral upper extremities.   ED Results / Procedures / Treatments   Labs (all labs ordered are listed, but only abnormal results are displayed) Labs Reviewed - No data to display   PROCEDURES:  Critical Care performed: No  Procedures   MEDICATIONS ORDERED IN ED: Medications  diazepam (VALIUM) tablet 2 mg (2 mg Oral Given 01/08/23 1010)  ketorolac (TORADOL) 15 MG/ML injection 15 mg (15 mg Intramuscular Given 01/08/23 1010)   dexamethasone (DECADRON) injection 10 mg (10 mg Intramuscular Given 01/08/23 1009)     IMPRESSION / MDM / ASSESSMENT AND PLAN / ED COURSE  I reviewed the triage vital signs and the nursing notes.                             57 year old female presents for evaluation of neck and left shoulder pain.  Patient is hypertensive, vital signs stable otherwise.  Patient was in significant distress on exam, pacing the room and tearful due to to her pain.  Differential diagnosis includes, but is not limited to, torticollis, cervical radiculopathy, muscle spasm, muscle strain, arthritis.  Patient's presentation is most consistent with acute, uncomplicated illness.  Patient reports a history of a bulging disc in her neck and was tender to palpation on exam.  Since her pain radiates down her arm I believe this is a cervical radiculopathy.  Patient was given Valium, Toradol and Decadron while in the ED.  After these medications she reported an improvement in her pain  but stated that it had not completely resolved.  She will be prescribed meloxicam, Robaxin and a Medrol Dosepak for home.  I also gave her follow-up information for an orthopedic doctor and advised her to schedule an appointment if her symptoms continue.  Patient was agreeable to plan, voiced understanding and was stable at discharge.     FINAL CLINICAL IMPRESSION(S) / ED DIAGNOSES   Final diagnoses:  Cervical radiculopathy     Rx / DC Orders   ED Discharge Orders          Ordered    meloxicam (MOBIC) 15 MG tablet  Daily        01/08/23 1052    methylPREDNISolone (MEDROL DOSEPAK) 4 MG TBPK tablet        01/08/23 1052    methocarbamol (ROBAXIN) 500 MG tablet  4 times daily        01/08/23 1052             Note:  This document was prepared using Dragon voice recognition software and may include unintentional dictation errors.   Cameron Ali, PA-C 01/08/23 1253    Merwyn Katos, MD 01/12/23 1700

## 2023-01-08 NOTE — ED Notes (Signed)
See triage note  Presents with pain to neck  which is moving to mid back and under left shoulder blade and arm   States pain started about 1 week ago w/o injury  Has tried OTC med and heat w/o relief
